# Patient Record
Sex: Female | Born: 1995 | Race: Black or African American | Hispanic: No | Marital: Single
Health system: Southern US, Community
[De-identification: ages and names within clinical notes are randomized; demographics above are authoritative.]

## PROBLEM LIST (undated history)

## (undated) DIAGNOSIS — J302 Other seasonal allergic rhinitis: Secondary | ICD-10-CM

## (undated) DIAGNOSIS — T7840XA Allergy, unspecified, initial encounter: Secondary | ICD-10-CM

## (undated) DIAGNOSIS — R51 Headache: Secondary | ICD-10-CM

## (undated) DIAGNOSIS — R55 Syncope and collapse: Secondary | ICD-10-CM

## (undated) HISTORY — DX: Syncope and collapse: R55

---
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
## 2015-01-31 ENCOUNTER — Other Ambulatory Visit (HOSPITAL_COMMUNITY)
Admission: RE | Admit: 2015-01-31 | Discharge: 2015-01-31 | Disposition: A | Discharge status: Home / Self Care | Payer: BC Managed Care – PPO | Source: Ambulatory Visit | Attending: Family Medicine | Admitting: Family Medicine

## 2015-01-31 DIAGNOSIS — Z01419 Encounter for gynecological examination (general) (routine) without abnormal findings: Secondary | ICD-10-CM | POA: Insufficient documentation

## 2023-05-26 ENCOUNTER — Telehealth: Payer: Self-pay | Admitting: Genetic Counselor

## 2023-05-26 ENCOUNTER — Encounter: Payer: Self-pay | Admitting: Genetic Counselor

## 2023-05-26 NOTE — Telephone Encounter (Signed)
 Patient called to set up genetic counseling appt due to mother's hx of BRCA2 gene mutation (tested at [facility]).  Scheduled 5/1 at 3:30 at [facility]

## 2023-06-08 ENCOUNTER — Inpatient Hospital Stay: Payer: Self-pay

## 2023-06-08 ENCOUNTER — Inpatient Hospital Stay: Payer: Self-pay | Attending: Nurse Practitioner | Admitting: Genetic Counselor

## 2023-06-08 DIAGNOSIS — Z8041 Family history of malignant neoplasm of ovary: Secondary | ICD-10-CM

## 2023-06-08 DIAGNOSIS — Z803 Family history of malignant neoplasm of breast: Secondary | ICD-10-CM | POA: Diagnosis not present

## 2023-06-08 DIAGNOSIS — Z8481 Family history of carrier of genetic disease: Secondary | ICD-10-CM | POA: Diagnosis not present

## 2023-06-08 DIAGNOSIS — Z1379 Encounter for other screening for genetic and chromosomal anomalies: Secondary | ICD-10-CM

## 2023-06-08 DIAGNOSIS — Z8042 Family history of malignant neoplasm of prostate: Secondary | ICD-10-CM

## 2023-06-08 LAB — GENETIC SCREENING ORDER

## 2023-06-09 ENCOUNTER — Encounter: Payer: Self-pay | Admitting: Genetic Counselor

## 2023-06-09 DIAGNOSIS — Z8481 Family history of carrier of genetic disease: Secondary | ICD-10-CM | POA: Insufficient documentation

## 2023-06-09 NOTE — Progress Notes (Signed)
 REFERRING PROVIDER: Self-referred  PRIMARY PROVIDER:  Jonathon Neighbors, MD  PRIMARY REASON FOR VISIT:  1. Family history of gene mutation   2. Family history of breast cancer   3. Family history of ovarian cancer   4. Family history of prostate cancer     HISTORY OF PRESENT ILLNESS:   Christy Weber, a 28 y.o. female, was seen for a [place] cancer genetics consultation due to a family history of a BRCA2 gene mutation in her mother.  Christy Weber presents to clinic today to discuss the possibility of a hereditary predisposition to cancer, to discuss genetic testing, and to further clarify her future cancer risks, as well as potential cancer risks for family members.   Christy Weber is a 28 y.o. female with no personal history of cancer.    CANCER HISTORY:  Oncology History   No history exists.     SCREENING/RISK FACTORS:  Breast imaging within the last year: no Number of breast biopsies: 0. Colonoscopy: no; not examined. Hysterectomy: no.  Ovaries intact: yes.  Menarche was at age 34 or 67.  Nulliparous. Menopausal status: premenopausal.  OCP use for approximately 1 years.  HRT use: 0 years. Dermatology screening: no   Past Medical History:  Diagnosis Date   Allergy    Chest pain    Family history of BRCA2 gene positive 06/09/2023   Headache(784.0)    Seasonal allergies    Syncope and collapse     No past surgical history on file.   FAMILY HISTORY:  We obtained a detailed, 4-generation family history.  Significant diagnoses are listed below: Family History  Problem Relation Age of Onset   BRCA 1/2 Mother        BRCA2 mutation   BRCA 1/2 Maternal Aunt        BRCA2 gene mutation   Prostate cancer Maternal Grandfather 28   Breast cancer Other        x2 MGM's sisters; dx 30s   Cancer Other        MGM's mother w/ pancreatic cancer (dx 80s); MGM's father w/ myleoma in 73s   Prostate cancer Other        MGF's brother and father (mets; 75s-60s)   Ovarian cancer Other         PGM's sister           Christy Weber's mother had genetic testing for the [facility] CancerNext-Expanded +RNAinsight Panel in March 2025, which showed a single pathogenic variant in BRCA2 called  [phone]).  The CancerNext-Expanded gene panel offered by [facility] and includes sequencing, rearrangement, and RNA analysis for the following 76 genes: AIP, ALK, APC, ATM, AXIN2, BAP1, BARD1, BMPR1A, BRCA1, BRCA2, BRIP1, CDC73, CDH1, CDK4, CDKN1B, CDKN2A, CEBPA, CHEK2, CTNNA1, DDX41, DICER1, ETV6, FH, FLCN, GATA2, LZTR1, MAX, MBD4, MEN1, MET, MLH1, MSH2, MSH3, MSH6, MUTYH, NF1, NF2, NTHL1, PALB2, PHOX2B, PMS2, POT1, PRKAR1A, PTCH1, PTEN, RAD51C, RAD51D, RB1, RET, RUNX1, SDHA, SDHAF2, SDHB, SDHC, SDHD, SMAD4, SMARCA4, SMARCB1, SMARCE1, STK11, SUFU, TMEM127, TP53, TSC1, TSC2, VHL, and WT1 (sequencing and deletion/duplication); EGFR, HOXB13, KIT, MITF, PDGFRA, POLD1, and POLE (sequencing only); EPCAM and GREM1 (deletion/duplication only).   Christy Weber has one maternal aunt that tested positive for the family BRCA2 gene mutation (report confirmed) as well as one maternal aunt who tested negative (no report available to confirm).  Christy Weber maternal grandmother had negative genetic testing for BRCA1/2 only in 2013 (report confirmed).  She is unaware of other family history of hereditary cancer genetic testing.  There is no reported Ashkenazi Jewish ancestry. There is no known consanguinity.  GENETIC COUNSELING ASSESSMENT: Christy Weber is a 28 y.o. female with a maternal family history of a BRCA2 gene mutation.  We, therefore, discussed and recommended the following at today's visit.   DISCUSSION: We discussed that 5 - 10% of cancer is hereditary, with most cases of hereditary breast and ovarian cancer associated with mutations in BRCA1/2.  Given her mother has a mutation in the BRCA2 gene, she has a 50% chance of having the same family mutation in BRCA2.  We reviewed the breast,  ovarian, pancreatic, and other cancer risks and management strategies associated with BRCA2 mutations.  We discussed that testing is beneficial for several reasons, including knowing about other cancer risks, identifying potential screening and risk-reduction options that may be appropriate, and understanding if other family members could be at an increased risk for cancer and allowing them to undergo genetic testing.  We reviewed the characteristics, features and inheritance patterns of hereditary cancer syndromes. We also discussed genetic testing, including the appropriate family members to test, the process of testing, insurance coverage and turn-around-time for results. We discussed the implications of a negative, positive, and variant of uncertain significant result. We recommended Christy Weber pursue genetic testing for the BRCA2 gene mutation.  We also discussed the option of panel based genetic testing. We discussed that while she is more distantly related to her paternal great aunt who had ovarian cancer, panel testing is still reasonable given other paternal relatives are unavailable for genetic testing.  She opted to forgo no charge family variant testing through [place] to proceed with panel-based testing billing through insurance.    Christy Weber was offered a common hereditary cancer panel (~40 genes) and an expanded pan-cancer panel (~70 genes). Christy Weber was informed of the benefits and limitations of each panel, including that expanded pan-cancer panels contain several genes that do not have clear management guidelines at this point in time.  We also discussed that as the number of genes included on a panel increases, the chances of variants of uncertain significance increases.  After considering the benefits and limitations of each gene panel, Christy Weber elected to have a common hereditary cancers panel through [facility].   The [facility] CancerNext+RNAinsight Panel includes sequencing, rearrangement  analysis, and RNA analysis for the following 40 genes: APC, ATM, BAP1, BARD1, BMPR1A, BRCA1, BRCA2, BRIP1, CDH1, CDKN2A, CHEK2, FH, FLCN, MET, MLH1, MSH2, MSH6, MUTYH, NF1, NTHL1, PALB2, PMS2, PTEN, RAD51C, RAD51D, RSP20, SMAD4, STK11, TP53, TSC1, TSC2, and VHL (sequencing and deletion/duplication); AXIN2, HOXB13, MBD4, MSH3, POLD1 and POLE (sequencing only); EPCAM and GREM1 (deletion/duplication only).  Based on Christy Weber's family history of of known BRCA2 gene mutation in her maternal family and a family  history of ovarian cancer in her paternal family, she meets NCCN medical criteria for genetic testing. Despite that she meets criteria, she may still have an out of pocket cost. We discussed that if her out of pocket cost for testing is over $100, the laboratory should contact them to discuss self-pay options and/or patient pay assistance programs  We discussed the Genetic Information Non-Discrimination Act (GINA) of 2008, which helps protect individuals against genetic discrimination based on their genetic test results.  It impacts both health insurance and employment.  With health insurance, it protects against genetic test results being used for increased premiums or policy termination. For employment, it protects against hiring, firing and promoting decisions based on genetic test results.  GINA  does not apply to those in the [employer], those who work for companies with less than 15 employees, and new life insurance or long-term disability Medical illustrator.  Health status due to a cancer diagnosis is not protected under GINA.  PLAN: After considering the risks, benefits, and limitations, Christy Weber provided informed consent to pursue genetic testing and the blood sample was sent to [facility] for analysis of the CancerNext +RNAInsight Panel. Results should be available within approximately 2-3 weeks' time, at which point they will be disclosed by telephone to Christy Weber, as will any additional  recommendations warranted by these results. Christy Weber will receive a summary of her genetic counseling visit and a copy of her results once available. This information will also be available in Epic.   Based on Christy Weber maternal family history of a BRCA2 gene mutation, we recommended relatives of her mother, especially  her mother's father, have genetic counseling and testing.  Relatives more closely related to her paternal aunt who had ovarian cancer are also candidates for genetic counseling/testing. Christy Weber can let us  know if we can be of any assistance in coordinating genetic counseling and/or testing for appropriate family members.   Lastly, we encouraged Christy Weber to remain in contact with cancer genetics annually so that we can continuously update the family history and inform her of any changes in cancer genetics and testing that may be of benefit for this family.   Christy Weber questions were answered to her satisfaction today. Our contact information was provided should additional questions or concerns arise.   Patrica Mendell M. Ora Billing, MS, [facility] Genetic Counselor [email or web address] ([phone]    40 minutes were spent on the date of the encounter in service to the patient including preparation, face-to-face consultation, documentation and care coordination.  The patient was accompanied by her mother.   Drs. Iruku, Gudena and/or Maryalice Smaller were available to discuss this case as needed.   _______________________________________________________________________ For Office Staff:  Number of people involved in session: 2 Was an Intern/ student involved with case: no

## 2023-07-05 ENCOUNTER — Encounter: Payer: Self-pay | Admitting: Genetic Counselor

## 2023-07-05 ENCOUNTER — Telehealth: Payer: Self-pay | Admitting: Genetic Counselor

## 2023-07-05 ENCOUNTER — Ambulatory Visit: Payer: Self-pay | Admitting: Genetic Counselor

## 2023-07-05 DIAGNOSIS — Z803 Family history of malignant neoplasm of breast: Secondary | ICD-10-CM

## 2023-07-05 DIAGNOSIS — Z8042 Family history of malignant neoplasm of prostate: Secondary | ICD-10-CM

## 2023-07-05 DIAGNOSIS — Z8041 Family history of malignant neoplasm of ovary: Secondary | ICD-10-CM

## 2023-07-05 DIAGNOSIS — Z8481 Family history of carrier of genetic disease: Secondary | ICD-10-CM

## 2023-07-05 DIAGNOSIS — Z1379 Encounter for other screening for genetic and chromosomal anomalies: Secondary | ICD-10-CM | POA: Insufficient documentation

## 2023-07-05 NOTE — Telephone Encounter (Signed)
 Contacted patient in attempt to disclose results of genetic testing.  LVM with contact information requesting a call back.

## 2023-07-05 NOTE — Telephone Encounter (Signed)
 Disclosed negative genetic testing.  BRCA2 mutation that was previously detected in her mother was not detected in her sample.  Chances of BRCA2- related cancers are closer to that of the general population

## 2023-07-12 NOTE — Progress Notes (Signed)
 HPI:   Christy Weber was previously seen in the [facility] due to a family history of BRCA2 gene mutation that was previously detected in he rmother.   Christy Weber recent genetic test results were disclosed to her by telephone. These results and recommendations are discussed in more detail below.  CANCER HISTORY:  Oncology History   No history exists.    FAMILY HISTORY:  We obtained a detailed, 4-generation family history.  Significant diagnoses are listed below: Family History  Problem Relation Age of Onset   BRCA 1/2 Mother        BRCA2 mutation   BRCA 1/2 Maternal Aunt        BRCA2 gene mutation   Prostate cancer Maternal Grandfather 95   Hypertension Paternal Grandmother        x2 MGM's sisters; dx 48s   Cancer Other        MGM's mother w/ pancreatic cancer (dx 7s); MGM's father w/ myleoma in 71s   Prostate cancer Other        MGF's brother and father (mets; 25s-60s)   Ovarian cancer Other        PGM's sister          Christy Weber's mother had genetic testing for the [facility] CancerNext-Expanded +RNAinsight Panel in March 2025, which showed a single pathogenic variant in BRCA2 called  [id number]).  The CancerNext-Expanded gene panel offered by [facility] and includes sequencing, rearrangement, and RNA analysis for the following 76 genes: AIP, ALK, APC, ATM, AXIN2, BAP1, BARD1, BMPR1A, BRCA1, BRCA2, BRIP1, CDC73, CDH1, CDK4, CDKN1B, CDKN2A, CEBPA, CHEK2, CTNNA1, DDX41, DICER1, ETV6, FH, FLCN, GATA2, LZTR1, MAX, MBD4, MEN1, MET, MLH1, MSH2, MSH3, MSH6, MUTYH, NF1, NF2, NTHL1, PALB2, PHOX2B, PMS2, POT1, PRKAR1A, PTCH1, PTEN, RAD51C, RAD51D, RB1, RET, RUNX1, SDHA, SDHAF2, SDHB, SDHC, SDHD, SMAD4, SMARCA4, SMARCB1, SMARCE1, STK11, SUFU, TMEM127, TP53, TSC1, TSC2, VHL, and WT1 (sequencing and deletion/duplication); EGFR, HOXB13, KIT, MITF, PDGFRA, POLD1, and POLE (sequencing only); EPCAM and GREM1 (deletion/duplication only).    Christy Weber has  one maternal aunt that tested positive for the family BRCA2 gene mutation (report confirmed) as well as one maternal aunt who tested negative (no report available to confirm).  Christy Weber maternal grandmother had negative genetic testing for BRCA1/2 only in 2013 (report confirmed).  She is unaware of other family history of hereditary cancer genetic testing.     There is no reported Ashkenazi Jewish ancestry. There is no known consanguinity.  GENETIC TEST RESULTS:  The [facility] CancerNext+RNAinsight Panel found no pathogenic mutations.   The [facility] CancerNext+RNAinsight Panel includes sequencing, rearrangement analysis, and RNA analysis for the following 40 genes: APC, ATM, BAP1, BARD1, BMPR1A, BRCA1, BRCA2, BRIP1, CDH1, CDKN2A, CHEK2, FH, FLCN, MET, MLH1, MSH2, MSH6, MUTYH, NF1, NTHL1, PALB2, PMS2, PTEN, RAD51C, RAD51D, RSP20, SMAD4, STK11, TP53, TSC1, TSC2, and VHL (sequencing and deletion/duplication); AXIN2, HOXB13, MBD4, MSH3, POLD1 and POLE (sequencing only); EPCAM and GREM1 (deletion/duplication only).   The test report has been scanned into EPIC and is located under the Molecular Pathology section of the Results Review tab.  A portion of the result report is included below for reference. Genetic testing reported out on Jul 04, 2023.     We recommended Ms. Kilty pursue testing for the familial hereditary cancer gene mutation called BRCA2 called  [id number] ([id number]). Ms. Teagarden test was normal and did not reveal the familial mutation. We call this result a true negative result because a cancer-causing mutation was identified in  Ms. Claflin family, and she did not inherit it.  Given this negative result, [name] chances of developing BRCA2-related cancers is closer to that of the general population.   ADDITIONAL GENETIC TESTING:   Christy Weber genetic testing was fairly extensive.  If there are additional relevant genes identified to increase cancer risk that can be analyzed in  the future, we would be happy to discuss and coordinate this testing at that time.     CANCER SCREENING RECOMMENDATIONS:  Christy Weber test result is considered negative (normal). The BRCA2 gene mutation that was previously detected in her mother was not detected in Christy Weber's sample; thus, BRCA2-related high risk screening is not indicated for Ms. Dowland based on this result.   An individual's cancer risk and medical management are not determined by genetic test results alone. Overall cancer risk assessment incorporates additional factors, including personal medical history, family history, and any available genetic information that may result in a personalized plan for cancer prevention and surveillance. Therefore, it is recommended she continue to follow the cancer management and screening guidelines provided by her primary healthcare provider.  RECOMMENDATIONS FOR FAMILY MEMBERS:   Since she did not inherit a identifiable mutation in a cancer predisposition gene included on this panel, any potential future children cannot inherited a known mutation from her in one of these genes. Other members of the family may still carry a pathogenic variant in one of these genes that Christy Weber did not inherit. Based on the family history, relatives of her mother, who was found to have a BRCA2 mutation, should have genetic counseling and testing. Given Christy Weber's maternal grandmother previously had negative genetic testing for BRCA1/2, her mother's father should have genetic testing.  Her maternal grandmother is a candidate for updated genetic testing given her family history.  Relatives more closely related to Christy Weber's paternal great aunt with a possible diagnosis of ovarian cancer should have genetic counseling/tesitng.  Christy Weber can let us  know if we can be of any assistance in coordinating genetic counseling and/or testing for these family members.     FOLLOW-UP:  Cancer genetics is a rapidly advancing  field and it is possible that new genetic tests will be appropriate for her and/or her family members in the future. We encourage [name] to remain in contact with cancer genetics, so we can update her personal and family histories and let her know of advances in cancer genetics that may benefit this family.   Our contact number was provided.  They are welcome to call us  at anytime with additional questions or concerns.   Travante Knee M. Ora Billing, MS, [facility] Genetic Counselor [email or web address] (P) [phone]
